# Patient Record
Sex: Female | Born: 1990 | Hispanic: Yes | Marital: Single | Smoking: Never smoker
Health system: Southern US, Community
[De-identification: ages and names within clinical notes are randomized; demographics above are authoritative.]

## PROBLEM LIST (undated history)

## (undated) DIAGNOSIS — R41840 Attention and concentration deficit: Secondary | ICD-10-CM

---
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
## 2012-11-12 ENCOUNTER — Other Ambulatory Visit: Payer: Self-pay

## 2012-11-30 ENCOUNTER — Encounter (HOSPITAL_COMMUNITY): Payer: Self-pay | Admitting: Emergency Medicine

## 2012-11-30 ENCOUNTER — Emergency Department (HOSPITAL_COMMUNITY)
Admission: EM | Admit: 2012-11-30 | Discharge: 2012-11-30 | Disposition: A | Discharge status: Home / Self Care | Payer: PRIVATE HEALTH INSURANCE | Source: Home / Self Care | Attending: Family Medicine | Admitting: Family Medicine

## 2012-11-30 DIAGNOSIS — J069 Acute upper respiratory infection, unspecified: Secondary | ICD-10-CM

## 2012-11-30 HISTORY — DX: Attention and concentration deficit: R41.840

## 2012-11-30 NOTE — ED Provider Notes (Signed)
CSN: [id number]     Arrival date & time 11/30/12  1127 History   First MD Initiated Contact with Patient 11/30/12 1310     Chief Complaint  Patient presents with  . URI   (Consider location/radiation/quality/duration/timing/severity/associated sxs/prior Treatment) Patient is a 22 y.o. female presenting with URI. The history is provided by the patient.  URI Presenting symptoms: congestion, cough and ear pain   Presenting symptoms: no fever, no rhinorrhea and no sore throat   Severity:  Moderate Onset quality:  Gradual Duration:  1 week Timing:  Constant Progression:  Unchanged Chronicity:  New Relieved by:  None tried Worsened by:  Nothing tried Ineffective treatments:  None tried   Past Medical History  Diagnosis Date  . Attention deficit    History reviewed. No pertinent past surgical history. Family History  Problem Relation Age of Onset  . Hypertension Mother   . Cancer Other    History  Substance Use Topics  . Smoking status: Never Smoker   . Smokeless tobacco: Never Used  . Alcohol Use: No   OB History   Grav Para Term Preterm Abortions TAB SAB Ect Mult Living                 Review of Systems  Constitutional: Negative for fever.  HENT: Positive for congestion, ear pain and postnasal drip. Negative for ear discharge, rhinorrhea, sinus pressure and sore throat.   Respiratory: Positive for cough.     Allergies  Review of patient's allergies indicates no known allergies.  Home Medications   Current Outpatient Rx  Name  Route  Sig  Dispense  Refill  . methylphenidate (CONCERTA) 54 MG CR tablet   Oral   Take 54 mg by mouth every morning.         . norethindrone-ethinyl estradiol (JUNEL FE,GILDESS FE,LOESTRIN FE) 1-20 MG-MCG tablet   Oral   Take 1 tablet by mouth daily.         . fluconazole (DIFLUCAN) 150 MG tablet   Oral   Take 1 tablet (150 mg total) by mouth daily.   2 tablet   5   . EXPIRED: fluticasone (FLONASE) 50 MCG/ACT nasal spray   Nasal   Place 2 sprays into the nose daily.   16 g   6    BP 108/72  Pulse 86  Temp(Src) 98.5 F (36.9 C) (Oral)  Resp 20  SpO2 98%  LMP 10/30/2012 Physical Exam  Constitutional: She appears well-developed and well-nourished. No distress.  HENT:  Right Ear: External ear and ear canal normal. A middle ear effusion is present.  Left Ear: Tympanic membrane, external ear and ear canal normal.  Nose: Mucosal edema present. No rhinorrhea. Right sinus exhibits no maxillary sinus tenderness and no frontal sinus tenderness. Left sinus exhibits no maxillary sinus tenderness and no frontal sinus tenderness.  Mouth/Throat: Oropharynx is clear and moist. No oropharyngeal exudate, posterior oropharyngeal edema, posterior oropharyngeal erythema or tonsillar abscesses.  R nasal mucosa edema, R tonsil slightly enlarged, R mid ear effusion.   Cardiovascular: Normal rate and regular rhythm.   Pulmonary/Chest: Effort normal and breath sounds normal.    ED Course  Procedures (including critical care time) Labs Review Labs Reviewed - No data to display Imaging Review No results found.  EKG Interpretation    Date/Time:    Ventricular Rate:    PR Interval:    QRS Duration:   QT Interval:    QTC Calculation:   R Axis:  Text Interpretation:              MDM   1. URI (upper respiratory infection)   Sx mostly on r side.  Recommended pt use saline nasal spray, afrin and cold medicine with decongestant like sudafed to help manage sx, and to return if no improvement with tx.      Cathlyn Parsons, NP 11/30/12 [phone]

## 2012-11-30 NOTE — ED Provider Notes (Signed)
Medical screening examination/treatment/procedure(s) were performed by a resident physician or non-physician practitioner and as the supervising physician I was immediately available for consultation/collaboration.  Clementeen Graham, MD      Rodolph Bong, MD 11/30/12 2111

## 2012-11-30 NOTE — ED Notes (Signed)
C/o URI type syx x 1 week, minimal relief w OTC medications

## 2013-04-13 ENCOUNTER — Emergency Department (HOSPITAL_COMMUNITY): Payer: PRIVATE HEALTH INSURANCE

## 2013-04-13 ENCOUNTER — Encounter (HOSPITAL_COMMUNITY): Payer: Self-pay | Admitting: Emergency Medicine

## 2013-04-13 ENCOUNTER — Emergency Department (HOSPITAL_COMMUNITY)
Admission: EM | Admit: 2013-04-13 | Discharge: 2013-04-13 | Disposition: A | Discharge status: Home / Self Care | Payer: PRIVATE HEALTH INSURANCE | Source: Home / Self Care

## 2013-04-13 ENCOUNTER — Emergency Department (HOSPITAL_COMMUNITY)
Admission: EM | Admit: 2013-04-13 | Discharge: 2013-04-13 | Disposition: A | Discharge status: Home / Self Care | Payer: PRIVATE HEALTH INSURANCE | Attending: Emergency Medicine | Admitting: Emergency Medicine

## 2013-04-13 DIAGNOSIS — Z79899 Other long term (current) drug therapy: Secondary | ICD-10-CM | POA: Insufficient documentation

## 2013-04-13 DIAGNOSIS — Z3202 Encounter for pregnancy test, result negative: Secondary | ICD-10-CM | POA: Insufficient documentation

## 2013-04-13 DIAGNOSIS — R1013 Epigastric pain: Secondary | ICD-10-CM | POA: Diagnosis not present

## 2013-04-13 DIAGNOSIS — R1110 Vomiting, unspecified: Secondary | ICD-10-CM

## 2013-04-13 DIAGNOSIS — R630 Anorexia: Secondary | ICD-10-CM | POA: Diagnosis not present

## 2013-04-13 DIAGNOSIS — F988 Other specified behavioral and emotional disorders with onset usually occurring in childhood and adolescence: Secondary | ICD-10-CM | POA: Insufficient documentation

## 2013-04-13 DIAGNOSIS — R109 Unspecified abdominal pain: Secondary | ICD-10-CM | POA: Diagnosis present

## 2013-04-13 LAB — CBC WITH DIFFERENTIAL/PLATELET
Basophils Absolute: 0 10*3/uL (ref 0.0–0.1)
Basophils Relative: 0 % (ref 0–1)
Eosinophils Absolute: 0 10*3/uL (ref 0.0–0.7)
Eosinophils Relative: 0 % (ref 0–5)
HCT: 40.1 % (ref 36.0–46.0)
Hemoglobin: 13.6 g/dL (ref 12.0–15.0)
LYMPHS ABS: 1.2 10*3/uL (ref 0.7–4.0)
LYMPHS PCT: 18 % (ref 12–46)
MCH: 27.1 pg (ref 26.0–34.0)
MCHC: 33.9 g/dL (ref 30.0–36.0)
MCV: 80 fL (ref 78.0–100.0)
Monocytes Absolute: 0.5 10*3/uL (ref 0.1–1.0)
Monocytes Relative: 9 % (ref 3–12)
NEUTROS ABS: 4.6 10*3/uL (ref 1.7–7.7)
NEUTROS PCT: 73 % (ref 43–77)
PLATELETS: 197 10*3/uL (ref 150–400)
RBC: 5.01 MIL/uL (ref 3.87–5.11)
RDW: 13.1 % (ref 11.5–15.5)
WBC: 6.4 10*3/uL (ref 4.0–10.5)

## 2013-04-13 LAB — URINALYSIS, ROUTINE W REFLEX MICROSCOPIC
GLUCOSE, UA: NEGATIVE mg/dL
HGB URINE DIPSTICK: NEGATIVE
Ketones, ur: 15 mg/dL — AB
Nitrite: NEGATIVE
PH: 6 (ref 5.0–8.0)
PROTEIN: NEGATIVE mg/dL
Specific Gravity, Urine: 1.037 — ABNORMAL HIGH (ref 1.005–1.030)
Urobilinogen, UA: 1 mg/dL (ref 0.0–1.0)

## 2013-04-13 LAB — COMPREHENSIVE METABOLIC PANEL
ALK PHOS: 84 U/L (ref 39–117)
ALT: 16 U/L (ref 0–35)
AST: 18 U/L (ref 0–37)
Albumin: 3.5 g/dL (ref 3.5–5.2)
BUN: 19 mg/dL (ref 6–23)
CO2: 27 meq/L (ref 19–32)
Calcium: 9.1 mg/dL (ref 8.4–10.5)
Chloride: 99 mEq/L (ref 96–112)
Creatinine, Ser: 0.8 mg/dL (ref 0.50–1.10)
GLUCOSE: 82 mg/dL (ref 70–99)
POTASSIUM: 3.8 meq/L (ref 3.7–5.3)
SODIUM: 140 meq/L (ref 137–147)
Total Bilirubin: 0.7 mg/dL (ref 0.3–1.2)
Total Protein: 7.9 g/dL (ref 6.0–8.3)

## 2013-04-13 LAB — URINE MICROSCOPIC-ADD ON

## 2013-04-13 LAB — LIPASE, BLOOD: Lipase: 13 U/L (ref 11–59)

## 2013-04-13 LAB — POC URINE PREG, ED: Preg Test, Ur: NEGATIVE

## 2013-04-13 MED ORDER — GI COCKTAIL ~~LOC~~
30.0000 mL | Freq: Once | ORAL | Status: AC
  Administered 2013-04-13: 30 mL via ORAL

## 2013-04-13 MED ORDER — OMEPRAZOLE 20 MG PO CPDR: 20.0000 mg | DELAYED_RELEASE_CAPSULE | Freq: Every day | ORAL | Status: AC

## 2013-04-13 MED ORDER — ONDANSETRON 4 MG PO TBDP
ORAL_TABLET | ORAL | Status: AC
  Filled 2013-04-13: qty 1

## 2013-04-13 MED ORDER — GI COCKTAIL ~~LOC~~
ORAL | Status: AC
  Filled 2013-04-13: qty 30

## 2013-04-13 MED ORDER — ONDANSETRON 4 MG PO TBDP
4.0000 mg | ORAL_TABLET | Freq: Once | ORAL | Status: AC
  Administered 2013-04-13: 4 mg via ORAL

## 2013-04-13 NOTE — ED Provider Notes (Signed)
Medical screening examination/treatment/procedure(s) were performed by resident physician or non-physician practitioner and as supervising physician I was immediately available for consultation/collaboration.   Erisha Paugh DOUGLAS MD.   Alfons Sulkowski D Gordon Carlson, MD 04/13/13 1401 

## 2013-04-13 NOTE — ED Notes (Signed)
C/o of epigastric pain x 4 days.   States pain goes away with pressing on abdomen.   On set of vomiting last night.  Denies diarrhea.   No changes in diet or new medications.   No relief with otc meds

## 2013-04-13 NOTE — ED Provider Notes (Signed)
CSN: [id number]     Arrival date & time 04/13/13  1018 History   First MD Initiated Contact with Patient 04/13/13 1107     Chief Complaint  Patient presents with  . Abdominal Pain  . Neck Pain   (Consider location/radiation/quality/duration/timing/severity/associated sxs/prior Treatment) HPI Comments: 23 Y O obese F with epigastric pain for about 1 week. Nonradiating, worse with drinking or eating. Last PM with onset of vomiting, particularly after drinking fluids. Unable to eat food. Pain comes and goes. Nothing makes it better. No meds tried.  Lives in [place] , [place] and wll not be going back home for another week.   Past Medical History  Diagnosis Date  . Attention deficit    History reviewed. No pertinent past surgical history. Family History  Problem Relation Age of Onset  . Hypertension Mother   . Cancer Other    History  Substance Use Topics  . Smoking status: Never Smoker   . Smokeless tobacco: Never Used  . Alcohol Use: No   OB History   Grav Para Term Preterm Abortions TAB SAB Ect Mult Living                 Review of Systems  Constitutional: Positive for activity change. Negative for fever.  HENT: Negative.   Respiratory: Negative.   Cardiovascular: Negative.   Gastrointestinal: Positive for nausea, vomiting and abdominal pain. Negative for diarrhea, constipation, blood in stool and abdominal distention.  Genitourinary: Negative.   Musculoskeletal: Negative.   Skin: Negative.   Neurological: Negative for dizziness, speech difficulty and headaches.    Allergies  Review of patient's allergies indicates no known allergies.  Home Medications   Current Outpatient Rx  Name  Route  Sig  Dispense  Refill  . methylphenidate (CONCERTA) 54 MG CR tablet   Oral   Take 54 mg by mouth every morning.         . norethindrone-ethinyl estradiol (JUNEL FE,GILDESS FE,LOESTRIN FE) 1-20 MG-MCG tablet   Oral   Take 1 tablet by mouth daily.         . fluconazole  (DIFLUCAN) 150 MG tablet   Oral   Take 1 tablet (150 mg total) by mouth daily.   2 tablet   5   . EXPIRED: fluticasone (FLONASE) 50 MCG/ACT nasal spray   Nasal   Place 2 sprays into the nose daily.   16 g   6    BP 120/74  Pulse 92  Temp(Src) 98.4 F (36.9 C) (Oral)  Resp 18  SpO2 100% Physical Exam  Nursing note and vitals reviewed. Constitutional: She is oriented to person, place, and time. She appears well-developed and well-nourished. No distress.  Eyes: Conjunctivae and EOM are normal.  Neck: Normal range of motion. Neck supple.  Cardiovascular: Normal rate, regular rhythm and normal heart sounds.   Pulmonary/Chest: Effort normal and breath sounds normal. No respiratory distress. She has no wheezes. She has no rales.  Abdominal: Soft. Bowel sounds are normal. She exhibits no distension and no mass. There is tenderness. There is no rebound.  Tenderness in epigastrium, lesser tenderness in RUQ.  Musculoskeletal: She exhibits no edema.  Neurological: She is alert and oriented to person, place, and time. She exhibits normal muscle tone.  Skin: Skin is warm and dry. No rash noted.  Psychiatric: She has a normal mood and affect.    ED Course  Procedures (including critical care time) Labs Review Labs Reviewed - No data to display Imaging Review No results  found.   MDM   1. Epigastric pain   2. Vomiting     Beeped Dr. Elnoria HowardHung 1200h. Re attempted beep 1235h.  Transfer to the ED via shuttle for eval and management of epigastric pain. Diff includes gastritis, PUD, biliary/cholestatic dz, pancreatitis... Pt has no local PCP. We have attempted to contact GI to see if they can initiate tests this week and f/u but have not responded.  Received GI Cocktail and zofran with partial relief, however, difficult to discern since the pain is colicy, comes and goes.      Hayden Rasmussenavid Loletha Bertini, NP 04/13/13 1308

## 2013-04-13 NOTE — Discharge Instructions (Signed)
Please follow-up with Gastroenterology in the next 2 weeks, or see your Primary Care Provider. Abdominal Pain, Adult Many things can cause abdominal pain. Usually, abdominal pain is not caused by a disease and will improve without treatment. It can often be observed and treated at home. Your health care provider will do a physical exam and possibly order blood tests and X-rays to help determine the seriousness of your pain. However, in many cases, more time must pass before a clear cause of the pain can be found. Before that point, your health care provider may not know if you need more testing or further treatment. HOME CARE INSTRUCTIONS  Monitor your abdominal pain for any changes. The following actions may help to alleviate any discomfort you are experiencing:  Only take over-the-counter or prescription medicines as directed by your health care provider.  Do not take laxatives unless directed to do so by your health care provider.  Try a clear liquid diet (broth, tea, or water) as directed by your health care provider. Slowly move to a bland diet as tolerated. SEEK MEDICAL CARE IF:  You have unexplained abdominal pain.  You have abdominal pain associated with nausea or diarrhea.  You have pain when you urinate or have a bowel movement.  You experience abdominal pain that wakes you in the night.  You have abdominal pain that is worsened or improved by eating food.  You have abdominal pain that is worsened with eating fatty foods. SEEK IMMEDIATE MEDICAL CARE IF:   Your pain does not go away within 2 hours.  You have a fever.  You keep throwing up (vomiting).  Your pain is felt only in portions of the abdomen, such as the right side or the left lower portion of the abdomen.  You pass bloody or black tarry stools. MAKE SURE YOU:  Understand these instructions.   Will watch your condition.   Will get help right away if you are not doing well or get worse.  Document  Released: 10/03/2004 Document Revised: 10/14/2012 Document Reviewed: 09/02/2012 [facility] Patient Information 2014 [place], [place].   Emergency Department Resource Guide 1) Find a Doctor and Pay Out of Pocket Although you won't have to find out who is covered by your insurance plan, it is a good idea to ask around and get recommendations. You will then need to call the office and see if the doctor you have chosen will accept you as a new patient and what types of options they offer for patients who are self-pay. Some doctors offer discounts or will set up payment plans for their patients who do not have insurance, but you will need to ask so you aren't surprised when you get to your appointment.  2) Contact Your Local Health Department Not all health departments have doctors that can see patients for sick visits, but many do, so it is worth a call to see if yours does. If you don't know where your local health department is, you can check in your phone book. The CDC also has a tool to help you locate your state's health department, and many state websites also have listings of all of their local health departments.  3) Find a Walk-in Clinic If your illness is not likely to be very severe or complicated, you may want to try a walk in clinic. These are popping up all over the country in pharmacies, drugstores, and shopping centers. They're usually staffed by nurse practitioners or physician assistants that have been trained to  treat common illnesses and complaints. They're usually fairly quick and inexpensive. However, if you have serious medical issues or chronic medical problems, these are probably not your best option.  No Primary Care Doctor: - Call Health Connect at  [phone] - they can help you locate a primary care doctor that  accepts your insurance, provides certain services, etc. - Physician Referral Service- [phone]  Chronic Pain Problems: Organization         Address  Phone    Notes  [facility]  [phone] Patients need to be referred by their primary care doctor.   Medication Assistance: Organization         Address  Phone   Notes  [facility] Medication [facility] [address] [place], [place] [address] [phone] --Must be a resident of [facility] -- Must have NO insurance coverage whatsoever (no Medicaid/ Medicare, etc.) -- The pt. MUST have a primary care doctor that directs their care regularly and follows them in the community   [facility]  [phone]   [employer]  [phone]    Agencies that provide inexpensive medical care: Organization         Address  Phone   Notes  [facility]  [phone]   [facility]    [phone]   [facility] [address] [place], [place] [address] [phone]   [facility] [address], [place] [phone]   Planned Parenthood    [phone]   [facility]    [phone]   [facility]  [address], [place] Phone:  [phone], Fax:  [phone] Hours of Operation:  9 am - 6 pm, M-F.  Also accepts Medicaid/Medicare and self-pay.  [facility]  [address], [place] Phone: [phone], Fax: [phone]. Hours of Operation:  8:30 am - 5:30 pm, M-F.  Also accepts Medicaid and self-pay.  [facility] [place] [address], [place] Phone: [phone]   [facility] [address] Natasha Bence [place], [place] [phone], Ext. [phone] Mondays & Thursdays: 7-9 AM.  First 15 patients are seen on a first come, first serve basis.    Medicaid-accepting [employer] Providers:  Organization         Address  Phone   Notes  [facility] [address],  [phone] Also accepts self-pay patients.  [facility]  [address] Laurell Josephs [place], [place]  [phone]   [facility] [address], [place] [phone]   [facility] [address] [place] [phone]   Renaye Rakers [address], [place]   [phone] Only accepts [place] Access [place] patients after they have their name applied to their card.   Self-Pay (no insurance) in [facility]:  Organization         Address  Phone   Notes  Sickle Cell Patients, [facility] [address] [place], [place] [phone]   [facility] [address] [place], [place] [phone]   [facility] [place]  [address], [place] [phone]   [facility]/Dr. Osei-Bonsu  [address], [place] or [address], [place] [phone] Phone number for both [facility] and [place] locations is the same.  [facility] [address], [place] [phone]   [facility] [place] [address] [place] [address], [place] or [address] [phone]) [phone]   [facility] [address], [place] [phone], phone; [phone], fax Sees patients 1st and 3rd Saturday of every month.  Must not qualify for public or private insurance (i.e. Medicaid, Medicare, [place] Health Choice, Veterans' Benefits)  Household income should be no more than 200% of the poverty level The clinic cannot treat you if you are pregnant or think you are pregnant  Sexually transmitted diseases are not treated at the clinic.    Dental Care: Organization         Address  Phone  Notes  [facility] [address], [place] [phone] Accepts children up to age 21 who are enrolled in [place] or [place] Health Choice; pregnant women with a Medicaid card; and children who have  applied for Medicaid or [place] Health Choice, but were declined, whose parents can pay a reduced fee at time of service.  [facility] [place]  [address], [place] [phone] Accepts children up to age 21 who are enrolled in [place] or [place] Health Choice; pregnant women with a Medicaid card; and children who have applied for Medicaid or  Health Choice, but were declined, whose parents can pay a reduced fee at time of service.  [facility]  [address], [place] [phone] Patients are seen by appointment only. Walk-ins are not accepted. [facility] will see patients 21 years of age and older. Monday - Tuesday (8am-5pm) Most Wednesdays (8:30-5pm) $30 per visit, cash only  [facility]  [address], [facility] [phone] Patients are seen by appointment only. Walk-ins are not accepted. [facility] will see patients 21 years of age and older. One Wednesday Evening (Monthly: Volunteer Based).  $30 per visit, cash only  [employer] [facility]  [phone] for adults; Children under age 34, call Graduate Pediatric Dentistry at [phone]. Children aged 634-14, please call [phone] to request a pediatric application.  Dental services are provided in all areas of dental care including fillings, crowns and bridges, complete and partial dentures, implants, gum treatment, root canals, and extractions. Preventive care is also provided. Treatment is provided to both adults and children. Patients are selected via a lottery and there is often a waiting list.   [facility] [address], [place]  [phone]) [phone] [email or web address]   [facility] [address], [place], [place] [phone], Ext. [phone] Second and Fourth Thursday of each month, opens at 6:30 AM; Clinic ends at 9 AM.  Patients are seen on a first-come first-served basis, and a  limited number are seen during each clinic.   [facility]  [address], [place], [place] [phone]   Eligibility Requirements You must have lived in [place], [place], or [place] counties for at least the last three months.   You cannot be eligible for state or federal sponsored [place] Cityhealthcare insurance, including CIG[employer]Veterans Administration, [place]icaid, or [employer]icare.   You generally cannot be eligible for healthcare insurance through your employer.    How to apply: Eligibility screenings are held every  Tuesday and Wednesday afternoon from 1:00 pm until 4:00 pm. You do not need an appointment for the interview!  [facility] [address], [place], [place] [phone]   [facility] Health Department  [phone]   [facility] Health Department  [phone]   [facility] Health Department  [phone]    Behavioral Health Resources in the Community: Intensive Outpatient Programs Organization         Address  Phone  Notes  [facility] Services [address] [place], [place] [phone]   [facility] [address], [place], [place] [phone]   [facility] [address] [place], [place]  [phone]   [facility] Mental Health [address]  [place], [place] [phone] or [phone]   Substance Abuse Resources Organization         Address  Phone  Notes  Alcohol and Drug Services  [phone]   [facility]  [phone]   [facility] [place]  [phone]   Floydene Flock  [phone]   Residential & Outpatient Substance Abuse Program  [phone]   Psychological Services Organization         Address  Phone  Notes  [facility] Behavioral Health  [phone]   [facility] Services  [phone]   [facility] Mental Health [address] [place] [phone] or [phone]    Mobile Crisis Teams Organization          Address  Phone  Notes  [facility], Mobile Crisis Care Unit  [phone]   [facility]  [address] [place], [place] [phone]   Doristine Locks [address] [place] [phone]    Self-Help/Support Groups Organization         Address  Phone             Notes  [facility] [place] - variety of support groups  [phone]- I7437963 Call for more information  Narcotics Anonymous (NA), [facility] [address], [employer] [place]  2 meetings at this location   Statistician         Address  Phone  Notes  [facility] [address],    [place]  [phone]   [facility]  [address], [place] [address], [place], [place] [phone]   [facility] [address] [place], [place] [phone] Admissions: 8am-3pm M-F  [facility] [address]    [place], [place] [phone]   [facility] [address] [place], [place], [place] [phone]   [facility] [address],  [place], [place] [phone]   [facility] - Intensive Outpatient [address] Laurell Josephs [address], [place], [place] [phone]   [facility] ([facility] [address] [place]  [place], [place] [phone] or [phone]   [facility] (RT[facility]) [address], [place], [place] [phone] Accepts Medicaid  [facility] [place] [address],  [place] [phone] Substance Abuse/Addiction Treatment   [facility] Organization         Address  Phone  Notes  [facility]  [phone]   Angie Fava, PhD [address] Ervin Knack [place], [place]   [phone] or [phone]   [facility] Behavioral   [address] [place], [place] [phone]   [facility] [address] [place], [place] [phone] Insurance/Medicaid/sponsorship  through [facility]  [facility] [address] [place], [place] [phone] [place], [place] [phone]    Dr. Adele Schilder  [phone]   [facility] Dept. 1) [address] [place] 2) [place] 3)  [place] [address], [place] [phone]  [phone]   [place] [phone] or [phone] (After Hours)

## 2013-04-13 NOTE — ED Notes (Signed)
PA at bedside.

## 2013-04-13 NOTE — ED Notes (Signed)
Pt was sent here from ucc. Reports mid abd pain x 1 week, has become more severe and unable to eat since yesterday, vomiting since last night. Denies diarrhea. Sent here for US.

## 2013-04-13 NOTE — ED Provider Notes (Signed)
CSN: [id number]     Arrival date & time 04/13/13  1318 History   First MD Initiated Contact with Patient 04/13/13 1704     Chief Complaint  Patient presents with  . Abdominal Pain     (Consider location/radiation/quality/duration/timing/severity/associated sxs/prior Treatment) HPI Comments: Pt presents to the Emergency Department with chief complaint of mid epigatric pain x 4 days and vomiting since last night. Pt was seen in Urgent Care and given GI cocktail which provided some relief, but was sent to the ED for labs and ultrasound when GI could not be reached. Pain is nonradiating and colicy. Vomiting began last night after eating dinner, with last episode at 6am this morning. Pt has been unable to keep food down since vomiting. Nothing makes it better or worse. Pt reports pain at 5/10. Pt denies HA, fever, chills, and diarrhea.   The history is provided by the patient. No language interpreter was used.    Past Medical History  Diagnosis Date  . Attention deficit    History reviewed. No pertinent past surgical history. Family History  Problem Relation Age of Onset  . Hypertension Mother   . Cancer Other    History  Substance Use Topics  . Smoking status: Never Smoker   . Smokeless tobacco: Never Used  . Alcohol Use: No   OB History   Grav Para Term Preterm Abortions TAB SAB Ect Mult Living                 Review of Systems  Constitutional: Positive for activity change and appetite change. Negative for fever and chills.  HENT: Negative.   Respiratory: Negative.   Cardiovascular: Negative.   Gastrointestinal: Positive for vomiting and abdominal pain (epigastric). Negative for nausea, diarrhea and constipation.  Endocrine: Negative.   Genitourinary: Negative for dysuria, vaginal bleeding, vaginal discharge and difficulty urinating.  Skin: Negative.   Neurological: Negative for dizziness and headaches.  Psychiatric/Behavioral: Negative.       Allergies  Review of  patient's allergies indicates no known allergies.  Home Medications   Current Outpatient Rx  Name  Route  Sig  Dispense  Refill  . ibuprofen (ADVIL,MOTRIN) 200 MG tablet   Oral   Take 200-800 mg by mouth every 4 (four) hours as needed for headache.         . methylphenidate (CONCERTA) 54 MG CR tablet   Oral   Take 54 mg by mouth every morning.         . norethindrone-ethinyl estradiol (JUNEL FE,GILDESS FE,LOESTRIN FE) 1-20 MG-MCG tablet   Oral   Take 1 tablet by mouth daily.          BP 133/79  Pulse 89  Temp(Src) 99 F (37.2 C) (Oral)  Resp 18  Ht 5\' 4"  (1.626 m)  Wt 267 lb 9.6 oz (121.383 kg)  BMI 45.91 kg/m2  SpO2 100% Physical Exam  Constitutional: She is oriented to person, place, and time. Vital signs are normal. She appears well-developed and well-nourished.  Eyes: Conjunctivae and EOM are normal.  Neck: Normal range of motion. Neck supple.  Cardiovascular: Normal rate, regular rhythm and normal heart sounds.   Pulmonary/Chest: Effort normal and breath sounds normal.  Abdominal: Soft. Bowel sounds are normal. She exhibits no distension and no mass. There is no tenderness. There is no rebound and no guarding.  No focal abdominal tenderness, no RLQ tenderness or pain at McBurney's point, no RUQ tenderness or Murphy's sign, no left-sided abdominal tenderness, no fluid wave, or  signs of peritonitis   Neurological: She is alert and oriented to person, place, and time.  Skin: Skin is warm, dry and intact.  Psychiatric: She has a normal mood and affect. Her speech is normal and behavior is normal.    ED Course  Procedures (including critical care time) Results for orders placed during the hospital encounter of 04/13/13  CBC WITH DIFFERENTIAL      Result Value Ref Range   WBC 6.4  4.0 - 10.5 K/uL   RBC 5.01  3.87 - 5.11 MIL/uL   Hemoglobin 13.6  12.0 - 15.0 g/dL   HCT 16.1  09.6 - 04.5 %   MCV 80.0  78.0 - 100.0 fL   MCH 27.1  26.0 - 34.0 pg   MCHC 33.9  30.0  - 36.0 g/dL   RDW 40.9  81.1 - 91.4 %   Platelets 197  150 - 400 K/uL   Neutrophils Relative % 73  43 - 77 %   Neutro Abs 4.6  1.7 - 7.7 K/uL   Lymphocytes Relative 18  12 - 46 %   Lymphs Abs 1.2  0.7 - 4.0 K/uL   Monocytes Relative 9  3 - 12 %   Monocytes Absolute 0.5  0.1 - 1.0 K/uL   Eosinophils Relative 0  0 - 5 %   Eosinophils Absolute 0.0  0.0 - 0.7 K/uL   Basophils Relative 0  0 - 1 %   Basophils Absolute 0.0  0.0 - 0.1 K/uL  COMPREHENSIVE METABOLIC PANEL      Result Value Ref Range   Sodium 140  137 - 147 mEq/L   Potassium 3.8  3.7 - 5.3 mEq/L   Chloride 99  96 - 112 mEq/L   CO2 27  19 - 32 mEq/L   Glucose, Bld 82  70 - 99 mg/dL   BUN 19  6 - 23 mg/dL   Creatinine, Ser 7.82  0.50 - 1.10 mg/dL   Calcium 9.1  8.4 - 95.6 mg/dL   Total Protein 7.9  6.0 - 8.3 g/dL   Albumin 3.5  3.5 - 5.2 g/dL   AST 18  0 - 37 U/L   ALT 16  0 - 35 U/L   Alkaline Phosphatase 84  39 - 117 U/L   Total Bilirubin 0.7  0.3 - 1.2 mg/dL   GFR calc non Af Amer >90  >90 mL/min   GFR calc Af Amer >90  >90 mL/min  LIPASE, BLOOD      Result Value Ref Range   Lipase 13  11 - 59 U/L  URINALYSIS, ROUTINE W REFLEX MICROSCOPIC      Result Value Ref Range   Color, Urine AMBER (*) YELLOW   APPearance CLEAR  CLEAR   Specific Gravity, Urine 1.037 (*) 1.005 - 1.030   pH 6.0  5.0 - 8.0   Glucose, UA NEGATIVE  NEGATIVE mg/dL   Hgb urine dipstick NEGATIVE  NEGATIVE   Bilirubin Urine SMALL (*) NEGATIVE   Ketones, ur 15 (*) NEGATIVE mg/dL   Protein, ur NEGATIVE  NEGATIVE mg/dL   Urobilinogen, UA 1.0  0.0 - 1.0 mg/dL   Nitrite NEGATIVE  NEGATIVE   Leukocytes, UA SMALL (*) NEGATIVE  URINE MICROSCOPIC-ADD ON      Result Value Ref Range   Squamous Epithelial / LPF FEW (*) RARE   WBC, UA 3-6  <3 WBC/hpf   Bacteria, UA FEW (*) RARE   Urine-Other MUCOUS PRESENT    POC URINE PREG,  ED      Result Value Ref Range   Preg Test, Ur NEGATIVE  NEGATIVE   [place] Abdomen Complete  04/13/2013   CLINICAL DATA:  Epigastric  pain.  EXAM: ULTRASOUND ABDOMEN COMPLETE  COMPARISON:  None.  FINDINGS: Gallbladder:  No gallstones or wall thickening visualized. No sonographic Murphy sign noted.  Common bile duct:  Diameter: 3.4 mm, normal.  Liver:  No focal lesion identified. Within normal limits in parenchymal echogenicity.  IVC:  Normal.  Pancreas:  Normal.  Spleen:  Normal.  10.8 cm in length.  Right Kidney:  Length: 10.5 cm. Echogenicity within normal limits. No mass or hydronephrosis visualized.  Left Kidney:  Length: 12.6 cm. Echogenicity within normal limits. No mass or hydronephrosis visualized.  Abdominal aorta:  1.8 cm maximum diameter.  Other findings:  None.  IMPRESSION: Normal exam.   Electronically Signed   By: Geanie CooleyJim  Maxwell M.D.   On: 04/13/2013 18:30      EKG Interpretation None      MDM   Final diagnoses:  Abdominal pain    Patient with abdominal pain x1 week. Also reports some vomiting. Labs are unremarkable. Abdomen is benign. No evidence of acute or surgical abdomen. Ultrasound is negative. Will refer the patient to gastroenterology, and have her followup with her primary care doctor within 2 weeks. Patient understands and agrees with the plan. She stabilized for discharge.    Roxy Horsemanobert Calen Posch, PA-C 04/13/13 1857

## 2013-04-14 NOTE — ED Provider Notes (Signed)
Medical screening examination/treatment/procedure(s) were performed by non-physician practitioner and as supervising physician I was immediately available for consultation/collaboration.   EKG Interpretation None       Donae Kueker R. Koren Sermersheim, MD 04/14/13 0022 

## 2014-05-12 ENCOUNTER — Encounter (HOSPITAL_COMMUNITY): Payer: Self-pay | Admitting: Emergency Medicine

## 2014-05-12 ENCOUNTER — Emergency Department (HOSPITAL_COMMUNITY)
Admission: EM | Admit: 2014-05-12 | Discharge: 2014-05-12 | Disposition: A | Discharge status: Home / Self Care | Payer: PRIVATE HEALTH INSURANCE | Source: Home / Self Care | Attending: Family Medicine | Admitting: Family Medicine

## 2014-05-12 DIAGNOSIS — H66002 Acute suppurative otitis media without spontaneous rupture of ear drum, left ear: Secondary | ICD-10-CM | POA: Diagnosis not present

## 2014-05-12 MED ORDER — CEFDINIR 300 MG PO CAPS: 300.0000 mg | ORAL_CAPSULE | Freq: Two times a day (BID) | ORAL | Status: DC

## 2014-05-12 MED ORDER — FLUCONAZOLE 150 MG PO TABS: 150.0000 mg | ORAL_TABLET | Freq: Once | ORAL | Status: DC

## 2014-05-12 NOTE — ED Notes (Signed)
C/o headache, eyes hurting, overall feeling bad.  General malaise.

## 2014-05-12 NOTE — ED Provider Notes (Signed)
Cristina Mack is a 24 y.o. female who presents to Urgent Care today for left ear pain associated with subjective fever headache fatigue nausea and runny nose. Patient has had a week's worth of URI symptoms but developed ear pain and decreased hearing today. No vomiting or diarrhea. She's tried Tylenol which helps some.   Past Medical History  Diagnosis Date  . Attention deficit    History reviewed. No pertinent past surgical history. History  Substance Use Topics  . Smoking status: Never Smoker   . Smokeless tobacco: Never Used  . Alcohol Use: No   ROS as above Medications: No current facility-administered medications for this encounter.   Current Outpatient Prescriptions  Medication Sig Dispense Refill  . cefdinir (OMNICEF) 300 MG capsule Take 1 capsule (300 mg total) by mouth 2 (two) times daily. 14 capsule 0  . fluconazole (DIFLUCAN) 150 MG tablet Take 1 tablet (150 mg total) by mouth once. 1 tablet 1  . ibuprofen (ADVIL,MOTRIN) 200 MG tablet Take 200-800 mg by mouth every 4 (four) hours as needed for headache.    . methylphenidate (CONCERTA) 54 MG CR tablet Take 54 mg by mouth every morning.    . norethindrone-ethinyl estradiol (JUNEL FE,GILDESS FE,LOESTRIN FE) 1-20 MG-MCG tablet Take 1 tablet by mouth daily.    Marland Kitchen. omeprazole (PRILOSEC) 20 MG capsule Take 1 capsule (20 mg total) by mouth daily. 30 capsule 0   No Known Allergies   Exam:  BP 151/83 mmHg  Pulse 95  Temp(Src) 98.7 F (37.1 C) (Oral)  Resp 18  SpO2 100% Gen: Well NAD HEENT: EOMI,  MMM left ear tympanic membrane effusion and erythematous and bulging. Right is normal mastoids nontender bilaterally. clear nasal discharge. Lungs: Normal work of breathing. CTABL Heart: RRR no MRG Abd: NABS, Soft. Nondistended, Nontender Exts: Brisk capillary refill, warm and well perfused.   No results found for this or any previous visit (from the past 24 hour(s)). No results found.  Assessment and Plan: 24 y.o. female with  otitis media treat with Omnicef. Fluconazole in case patient develops yeast infection.  Discussed warning signs or symptoms. Please see discharge instructions. Patient expresses understanding.     Rodolph BongEvan S Taariq Leitz, MD 05/12/14 2002

## 2014-05-12 NOTE — Discharge Instructions (Signed)
Thank you for coming in today. °Call or go to the emergency room if you get worse, have trouble breathing, have chest pains, or palpitations.  ° ° °Otitis Media With Effusion °Otitis media with effusion is the presence of fluid in the middle ear. This is a common problem in children, which often follows ear infections. It may be present for weeks or longer after the infection. Unlike an acute ear infection, otitis media with effusion refers only to fluid behind the ear drum and not infection. Children with repeated ear and sinus infections and allergy problems are the most likely to get otitis media with effusion. °CAUSES  °The most frequent cause of the fluid buildup is dysfunction of the eustachian tubes. These are the tubes that drain fluid in the ears to the back of the nose (nasopharynx). °SYMPTOMS  °· The main symptom of this condition is hearing loss. As a result, you or your child may: °¨ Listen to the TV at a loud volume. °¨ Not respond to questions. °¨ Ask "what" often when spoken to. °¨ Mistake or confuse one sound or word for another. °· There may be a sensation of fullness or pressure but usually not pain. °DIAGNOSIS  °· Your health care provider will diagnose this condition by examining you or your child's ears. °· Your health care provider may test the pressure in you or your child's ear with a tympanometer. °· A hearing test may be conducted if the problem persists. °TREATMENT  °· Treatment depends on the duration and the effects of the effusion. °· Antibiotics, decongestants, nose drops, and cortisone-type drugs (tablets or nasal spray) may not be helpful. °· Children with persistent ear effusions may have delayed language or behavioral problems. Children at risk for developmental delays in hearing, learning, and speech may require referral to a specialist earlier than children not at risk. °· You or your child's health care provider may suggest a referral to an ear, nose, and throat surgeon for  treatment. The following may help restore normal hearing: °¨ Drainage of fluid. °¨ Placement of ear tubes (tympanostomy tubes). °¨ Removal of adenoids (adenoidectomy). °HOME CARE INSTRUCTIONS  °· Avoid secondhand smoke. °· Infants who are breastfed are less likely to have this condition. °· Avoid feeding infants while they are lying flat. °· Avoid known environmental allergens. °· Avoid people who are sick. °SEEK MEDICAL CARE IF:  °· Hearing is not better in 3 months. °· Hearing is worse. °· Ear pain. °· Drainage from the ear. °· Dizziness. °MAKE SURE YOU:  °· Understand these instructions. °· Will watch your condition. °· Will get help right away if you are not doing well or get worse. °Document Released: 02/01/2004 Document Revised: 05/10/2013 Document Reviewed: 07/21/2012 °Exit[employer]® Patient Information ©2015 [employer]. This information is not intended to replace advice given to you by your health care provider. Make sure you discuss any questions you have with your health care provider. ° °

## 2014-06-25 ENCOUNTER — Encounter (HOSPITAL_COMMUNITY): Payer: Self-pay | Admitting: Emergency Medicine

## 2014-06-25 ENCOUNTER — Emergency Department (HOSPITAL_COMMUNITY)
Admission: EM | Admit: 2014-06-25 | Discharge: 2014-06-25 | Disposition: A | Discharge status: Home / Self Care | Payer: PRIVATE HEALTH INSURANCE | Source: Home / Self Care | Attending: Emergency Medicine | Admitting: Emergency Medicine

## 2014-06-25 DIAGNOSIS — J039 Acute tonsillitis, unspecified: Secondary | ICD-10-CM

## 2014-06-25 DIAGNOSIS — L304 Erythema intertrigo: Secondary | ICD-10-CM

## 2014-06-25 LAB — POCT RAPID STREP A: STREPTOCOCCUS, GROUP A SCREEN (DIRECT): NEGATIVE

## 2014-06-25 MED ORDER — CEFDINIR 300 MG PO CAPS: 300.0000 mg | ORAL_CAPSULE | Freq: Two times a day (BID) | ORAL | Status: AC

## 2014-06-25 MED ORDER — FLUCONAZOLE 150 MG PO TABS: 150.0000 mg | ORAL_TABLET | Freq: Once | ORAL | Status: DC

## 2014-06-25 MED ORDER — NYSTATIN 100000 UNIT/GM EX CREA: TOPICAL_CREAM | CUTANEOUS | Status: DC

## 2014-06-25 NOTE — ED Provider Notes (Signed)
CSN: [id number]     Arrival date & time 06/25/14  1929 History   First MD Initiated Contact with Patient 06/25/14 2017     Chief Complaint  Patient presents with  . Rash   (Consider location/radiation/quality/duration/timing/severity/associated sxs/prior Treatment) HPI  She is a 24 year old woman here for evaluation of sore throat and rash. She states her sore throat started about 3 days ago. It is painful to swallow. The pain radiates into her right ear. She denies any nasal congestion, rhinorrhea, nausea, vomiting. She reports a subjective fever last night. She has not tried any medications. Today, she noticed a red rash underneath her left breast. It is not painful. It does not itch. She has not tried anything.  Past Medical History  Diagnosis Date  . Attention deficit    History reviewed. No pertinent past surgical history. Family History  Problem Relation Age of Onset  . Hypertension Mother   . Cancer Other    History  Substance Use Topics  . Smoking status: Never Smoker   . Smokeless tobacco: Never Used  . Alcohol Use: No   OB History    No data available     Review of Systems As in history of present illness Allergies  Review of patient's allergies indicates no known allergies.  Home Medications   Prior to Admission medications   Medication Sig Start Date End Date Taking? Authorizing Provider  methylphenidate (CONCERTA) 54 MG CR tablet Take 54 mg by mouth every morning.   Yes Historical Provider, MD  norethindrone-ethinyl estradiol (JUNEL FE,GILDESS FE,LOESTRIN FE) 1-20 MG-MCG tablet Take 1 tablet by mouth daily.   Yes Historical Provider, MD  cefdinir (OMNICEF) 300 MG capsule Take 1 capsule (300 mg total) by mouth 2 (two) times daily. 06/25/14   Charm Rings, MD  fluconazole (DIFLUCAN) 150 MG tablet Take 1 tablet (150 mg total) by mouth once. AFTER finishing Truman Hayward 06/25/14   Charm Rings, MD  ibuprofen (ADVIL,MOTRIN) 200 MG tablet Take 200-800 mg by mouth every 4  (four) hours as needed for headache.    Historical Provider, MD  nystatin cream (MYCOSTATIN) Apply to affected area 2 times daily for 1 week. 06/25/14   Charm Rings, MD  omeprazole (PRILOSEC) 20 MG capsule Take 1 capsule (20 mg total) by mouth daily. 04/13/13   Roxy Horseman, PA-C   BP 131/78 mmHg  Pulse 85  Temp(Src) 98.5 F (36.9 C) (Oral)  Resp 20  SpO2 100%  LMP 06/18/2014 Physical Exam  Constitutional: She is oriented to person, place, and time. She appears well-developed and well-nourished. No distress.  HENT:  Nose: Nose normal.  Right tonsil is erythematous and swollen with exudate present. Bilateral TMs are normal.  Neck: Neck supple.  Cardiovascular: Normal rate.   Pulmonary/Chest: Effort normal.  Lymphadenopathy:    She has no cervical adenopathy.  Neurological: She is alert and oriented to person, place, and time.  Skin:  Well-defined erythematous rash underneath the left breast    ED Course  Procedures (including critical care time) Labs Review Labs Reviewed  POCT RAPID STREP A    Imaging Review No results found.   MDM   1. Tonsillitis   2. Intertrigo    Treat tonsillitis with Omnicef. Nystatin for intertrigo. Diflucan to take after Omnicef. Follow-up as needed.    Charm Rings, MD 06/25/14 2052

## 2014-06-25 NOTE — Discharge Instructions (Signed)
You have an infection of your tonsils. Take Omnicef 1 pill twice a day for 7 days.  The rash under your breast is caused by yeast. Wash the area with soap and water twice a day. Dry very well. Apply nystatin twice a day.  Once you have finished her antibiotics, take Diflucan.  Follow-up as needed.

## 2014-06-25 NOTE — ED Notes (Signed)
C/o rash under right breast onset today Also reports ST x2-3 days associated w/right ear pain Denies fevers, chills Alert, no signs of acute distress.

## 2014-06-28 LAB — CULTURE, GROUP A STREP: STREP A CULTURE: NEGATIVE

## 2014-06-28 NOTE — ED Notes (Signed)
C/o not feeling any better, feeling worse, and her neck feels weak. Patient states she has take Rx for 2 days. Strep negative. Patient advised to be rechecked, as she is feeling worse and having new symptoms

## 2014-11-04 ENCOUNTER — Emergency Department (HOSPITAL_COMMUNITY)
Admission: EM | Admit: 2014-11-04 | Discharge: 2014-11-04 | Disposition: A | Discharge status: Home / Self Care | Payer: PRIVATE HEALTH INSURANCE | Attending: Emergency Medicine | Admitting: Emergency Medicine

## 2014-11-04 ENCOUNTER — Encounter (HOSPITAL_COMMUNITY): Payer: Self-pay | Admitting: Emergency Medicine

## 2014-11-04 DIAGNOSIS — Z79818 Long term (current) use of other agents affecting estrogen receptors and estrogen levels: Secondary | ICD-10-CM | POA: Diagnosis not present

## 2014-11-04 DIAGNOSIS — Z8659 Personal history of other mental and behavioral disorders: Secondary | ICD-10-CM | POA: Insufficient documentation

## 2014-11-04 DIAGNOSIS — Z79899 Other long term (current) drug therapy: Secondary | ICD-10-CM | POA: Diagnosis not present

## 2014-11-04 DIAGNOSIS — Z792 Long term (current) use of antibiotics: Secondary | ICD-10-CM | POA: Insufficient documentation

## 2014-11-04 DIAGNOSIS — M5432 Sciatica, left side: Secondary | ICD-10-CM | POA: Diagnosis not present

## 2014-11-04 DIAGNOSIS — M545 Low back pain: Secondary | ICD-10-CM | POA: Diagnosis present

## 2014-11-04 MED ORDER — PREDNISONE 10 MG (21) PO TBPK: 10.0000 mg | ORAL_TABLET | Freq: Every day | ORAL | Status: DC

## 2014-11-04 MED ORDER — CYCLOBENZAPRINE HCL 5 MG PO TABS: 5.0000 mg | ORAL_TABLET | Freq: Three times a day (TID) | ORAL | Status: DC | PRN

## 2014-11-04 MED ORDER — HYDROCODONE-ACETAMINOPHEN 5-325 MG PO TABS: 1.0000 | ORAL_TABLET | Freq: Four times a day (QID) | ORAL | Status: DC | PRN

## 2014-11-04 NOTE — ED Notes (Signed)
C/o LBP. Had episode 2 weeks ago. Saw PCP in [place], had "three shots". States the pain resolved but came back 2 days ago.

## 2014-11-04 NOTE — Discharge Instructions (Signed)
Do not drive while taking the narcotic or muscle relaxant because they will make you sleepy. Follow up with your doctor when you get home for further evaluation.

## 2014-11-04 NOTE — ED Provider Notes (Signed)
CSN: [id number]   Arrival date & time 11/04/14 1250  History  By signing my name below, I, Cristina Mack, attest that this documentation has been prepared under the direction and in the presence of Kerrie Buffalo, NP. Electronically Signed: Bethel Mack, ED Scribe. 11/04/2014. 2:36 PM. Chief Complaint  Patient presents with  . Back Pain    HPI Patient is a 24 y.o. female presenting with back pain. The history is provided by the patient. No language interpreter was used.  Back Pain Location:  Lumbar spine Quality:  Aching Radiates to:  Does not radiate Pain severity:  Severe Onset quality:  Sudden Duration:  2 weeks Timing:  Constant Progression:  Unchanged Chronicity:  New Context: not falling, not jumping from heights, not lifting heavy objects, not MCA, not MVA, not occupational injury, not pedestrian accident, not physical stress, not recent injury and not twisting   Relieved by: Standing and pressure. Worsened by:  Movement Ineffective treatments:  Ibuprofen and muscle relaxants Associated symptoms: no weakness    Cristina Mack is a 24 y.o. female who presents to the Emergency Department complaining of new and constant left lower back pain with sudden onset 2 weeks ago. Pt describes the pain as aching and rates it 9/10 in severity.  She was seen by a family doctor in [place] 2 weeks ago where she had a negative XR and was given "3 shots" with some pain improvement. She remembers being given cortisone and lidocaine but is unsure what the other injection contained. The pain returned 2 days ago and was worse when she woke up this morning. The pain does not radiate down the LLE. Movement of the neck variably exacerbates the pain. Standing and pressure improve the pain. 800 mg of motrin and 1 dose of a muscle relaxant provided insufficient relief PTA. Her last dose of Motrin was 30 minutes ago and before that she had a dose last night around 1 AM. No new exercise programs, lifting,  pulling, or straining. She denies focal weakness and incontinence of bowel or bladder.  Pt is visiting the area for the weekend. Her only daily medications are adderall and birth control.   Past Medical History  Diagnosis Date  . Attention deficit     History reviewed. No pertinent past surgical history.  Family History  Problem Relation Age of Onset  . Hypertension Mother   . Cancer Other     Social History  Substance Use Topics  . Smoking status: Never Smoker   . Smokeless tobacco: Never Used  . Alcohol Use: No     Review of Systems  Musculoskeletal: Positive for back pain.  Neurological: Negative for weakness.  All other systems reviewed and are negative.   Home Medications   Prior to Admission medications   Medication Sig Start Date End Date Taking? Authorizing Provider  cefdinir (OMNICEF) 300 MG capsule Take 1 capsule (300 mg total) by mouth 2 (two) times daily. 06/25/14   Charm Rings, MD  cyclobenzaprine (FLEXERIL) 5 MG tablet Take 1 tablet (5 mg total) by mouth 3 (three) times daily as needed for muscle spasms. 11/04/14   Rebie Peale Orlene Och, NP  fluconazole (DIFLUCAN) 150 MG tablet Take 1 tablet (150 mg total) by mouth once. AFTER finishing Truman Hayward 06/25/14   Charm Rings, MD  HYDROcodone-acetaminophen (NORCO) 5-325 MG tablet Take 1 tablet by mouth every 6 (six) hours as needed. 11/04/14   Decie Verne Orlene Och, NP  ibuprofen (ADVIL,MOTRIN) 200 MG tablet Take 200-800 mg by  mouth every 4 (four) hours as needed for headache.    Historical Provider, MD  methylphenidate (CONCERTA) 54 MG CR tablet Take 54 mg by mouth every morning.    Historical Provider, MD  norethindrone-ethinyl estradiol (JUNEL FE,GILDESS FE,LOESTRIN FE) 1-20 MG-MCG tablet Take 1 tablet by mouth daily.    Historical Provider, MD  nystatin cream (MYCOSTATIN) Apply to affected area 2 times daily for 1 week. 06/25/14   Charm RingsErin J Honig, MD  omeprazole (PRILOSEC) 20 MG capsule Take 1 capsule (20 mg total) by mouth daily. 04/13/13    Roxy Horsemanobert Browning, PA-C  predniSONE (STERAPRED UNI-PAK 21 TAB) 10 MG (21) TBPK tablet Take 1 tablet (10 mg total) by mouth daily. Take 6 tabs by mouth daily  for 1 day, then 5 tabs for 1 day, then 4 tabs for 1 day, then 3 tabs for 1 day, 2 tabs for 1 day, then 1 tab by mouth daily for 1 days 11/04/14   Janne NapoleonHope M Skylar Flynt, NP    Allergies  Review of patient's allergies indicates no known allergies.  Triage Vitals: BP 135/64 mmHg  Pulse 96  Temp(Src) 97.4 F (36.3 C) (Oral)  Resp 20  Ht 5\' 4"  (1.626 m)  Wt 274 lb (124.286 kg)  BMI 47.01 kg/m2  SpO2 99%  Physical Exam  Constitutional: She is oriented to person, place, and time. She appears well-developed and well-nourished. No distress.  HENT:  Head: Normocephalic and atraumatic.  Right Ear: Tympanic membrane normal.  Left Ear: Tympanic membrane normal.  Nose: Nose normal.  Mouth/Throat: Uvula is midline, oropharynx is clear and moist and mucous membranes are normal.  Eyes: EOM are normal.  Neck: Normal range of motion. Neck supple.  Cardiovascular: Normal rate and regular rhythm.   Pulmonary/Chest: Effort normal. She has no wheezes. She has no rales.  Abdominal: Soft. Bowel sounds are normal. There is no tenderness.  Musculoskeletal: Normal range of motion.       Lumbar back: She exhibits pain and spasm. She exhibits normal pulse.  No tenderness over the cervical, thoracic, or lumbar spine. Tenderness and spasm over the left sciatic nerve.  Neurological: She is alert and oriented to person, place, and time. She has normal strength. No cranial nerve deficit or sensory deficit. Gait normal.  Reflex Scores:      Bicep reflexes are 2+ on the right side and 2+ on the left side.      Brachioradialis reflexes are 2+ on the right side and 2+ on the left side.      Patellar reflexes are 2+ on the right side and 2+ on the left side.      Achilles reflexes are 2+ on the right side and 2+ on the left side. Skin: Skin is warm and dry.  Psychiatric:  She has a normal mood and affect. Her behavior is normal.  Nursing note and vitals reviewed.   ED Course  Procedures  DIAGNOSTIC STUDIES: Oxygen Saturation is 99% on RA,  normal by my interpretation.    COORDINATION OF CARE: 1:19 PM Discussed treatment plan which includes steroid taper and muscle relaxant with pt at bedside and pt agreed to the plan.  MDM  24 y.o. female with low back pain with initial onset 2 weeks ago and [facility] Stable for d/c without focal neuro deficits. Will treat for pain and muscle spasm and she will follow up with her PCP when she returns home in 2 days. She will return here  sooner for worsening symptoms.   Final diagnoses:  Sciatica of left side    I personally performed the services described in this documentation, which was scribed in my presence. The recorded information has been reviewed and is accurate.      [address] [place], [place] 11/05/14 1612  Raeford Razor, MD 11/13/14 2116

## 2017-10-30 ENCOUNTER — Encounter (HOSPITAL_COMMUNITY): Payer: Self-pay

## 2017-10-30 ENCOUNTER — Ambulatory Visit (HOSPITAL_COMMUNITY)
Admission: EM | Admit: 2017-10-30 | Discharge: 2017-10-30 | Disposition: A | Discharge status: Home / Self Care | Payer: PRIVATE HEALTH INSURANCE | Attending: Family Medicine | Admitting: Family Medicine

## 2017-10-30 DIAGNOSIS — R519 Headache, unspecified: Secondary | ICD-10-CM

## 2017-10-30 DIAGNOSIS — R110 Nausea: Secondary | ICD-10-CM

## 2017-10-30 DIAGNOSIS — J069 Acute upper respiratory infection, unspecified: Secondary | ICD-10-CM

## 2017-10-30 DIAGNOSIS — R51 Headache: Secondary | ICD-10-CM

## 2017-10-30 MED ORDER — KETOROLAC TROMETHAMINE 60 MG/2ML IM SOLN
60.0000 mg | Freq: Once | INTRAMUSCULAR | Status: AC
  Administered 2017-10-30: 60 mg via INTRAMUSCULAR

## 2017-10-30 MED ORDER — FLUTICASONE PROPIONATE 50 MCG/ACT NA SUSP: 1.0000 | Freq: Every day | NASAL | 2 refills | Status: AC

## 2017-10-30 MED ORDER — KETOROLAC TROMETHAMINE 60 MG/2ML IM SOLN
INTRAMUSCULAR | Status: AC
  Filled 2017-10-30: qty 2

## 2017-10-30 MED ORDER — ONDANSETRON 4 MG PO TBDP
ORAL_TABLET | ORAL | Status: AC
  Filled 2017-10-30: qty 1

## 2017-10-30 MED ORDER — ONDANSETRON 4 MG PO TBDP
4.0000 mg | ORAL_TABLET | Freq: Once | ORAL | Status: AC
  Administered 2017-10-30: 4 mg via ORAL

## 2017-10-30 NOTE — ED Triage Notes (Signed)
Pt presents with persistent cough, nausea and ongoing headache. Pt was diagnosed with bronchitis yesterday and given medication but she says it is not working.

## 2017-10-30 NOTE — ED Provider Notes (Signed)
[facility]    CSN: [id number] Arrival date & time: 10/30/17  0901     History   Chief Complaint Chief Complaint  Patient presents with  . Cough  . Nausea    HPI Ronnita Paz is a 27 y.o. female.   Landrie presents with complaints of headache and nausea, in additional to ear pressure and cough. Was seen at a different urgent care yesterday and was prescribed cefdinir, zofran and albuterol and diagnosed with bronchitis. She states her nausea has persisted and feels that the nausea medication has worsened her headache. Tylenol and motrin help but the headache returns once they wear off. No known fevers. Ibuprofen last at 0700. Tylenol yesterday. Has had some chills. No vision change or weakness. No abdominal pain. No shortness of breath . Normal BM today. Currently still on her period. Nausea without vomiting. Cough has improved. Headache is frontal as well as posterior occipital. Hasn't taken any nausea medication today. Hx of ADD.     ROS per HPI.      Past Medical History:  Diagnosis Date  . Attention deficit     Patient Active Problem List   Diagnosis Date Noted  . ADD (attention deficit disorder) 02/08/2011    History reviewed. No pertinent surgical history.  OB History   None      Home Medications    Prior to Admission medications   Medication Sig Start Date End Date Taking? Authorizing Provider  cefdinir (OMNICEF) 300 MG capsule Take 1 capsule (300 mg total) by mouth 2 (two) times daily. 06/25/14   Charm Rings, MD  fluticasone (FLONASE) 50 MCG/ACT nasal spray Place 1 spray into both nostrils daily. 10/30/17   Georgetta Haber, NP  ibuprofen (ADVIL,MOTRIN) 200 MG tablet Take 200-800 mg by mouth every 4 (four) hours as needed for headache.    [provider]  methylphenidate (CONCERTA) 54 MG CR tablet Take 54 mg by mouth every morning.    [provider]  omeprazole (PRILOSEC) 20 MG capsule Take 1 capsule (20 mg total) by mouth  daily. 04/13/13   Roxy Horseman, PA-C    Family History Family History  Problem Relation Age of Onset  . Hypertension Mother   . Cancer Other     Social History Social History   Tobacco Use  . Smoking status: Never Smoker  . Smokeless tobacco: Never Used  Substance Use Topics  . Alcohol use: No  . Drug use: Not on file     Allergies   Penicillins   Review of Systems Review of Systems   Physical Exam Triage Vital Signs ED Triage Vitals  Enc Vitals Group     BP 10/30/17 0917 120/85     Pulse Rate 10/30/17 0917 89     Resp 10/30/17 0917 20     Temp 10/30/17 0917 97.7 F (36.5 C)     Temp Source 10/30/17 0917 Oral     SpO2 10/30/17 0917 100 %     Weight --      Height --      Head Circumference --      Peak Flow --      Pain Score 10/30/17 0918 6     Pain Loc --      Pain Edu? --      Excl. in GC? --    No data found.  Updated Vital Signs BP 120/85 (BP Location: Left Arm)   Pulse 89   Temp 97.7 F (36.5 C) (Oral)  Resp 20   LMP 10/26/2017   SpO2 100%    Physical Exam  Constitutional: She is oriented to person, place, and time. She appears well-developed and well-nourished. No distress.  Tearful in discomfort   HENT:  Head: Normocephalic and atraumatic.  Right Ear: Tympanic membrane, external ear and ear canal normal.  Left Ear: Tympanic membrane, external ear and ear canal normal.  Nose: Rhinorrhea present.  Mouth/Throat: Uvula is midline, oropharynx is clear and moist and mucous membranes are normal. No tonsillar exudate.  Eyes: Pupils are equal, round, and reactive to light. Conjunctivae and EOM are normal.  Cardiovascular: Normal rate, regular rhythm and normal heart sounds.  Pulmonary/Chest: Effort normal and breath sounds normal.  No cough throughout exam   Neurological: She is alert and oriented to person, place, and time. No cranial nerve deficit or sensory deficit. She exhibits normal muscle tone.  Skin: Skin is warm and dry.      UC Treatments / Results  Labs (all labs ordered are listed, but only abnormal results are displayed) Labs Reviewed - No data to display  EKG None  Radiology No results found.  Procedures Procedures (including critical care time)  Medications Ordered in UC Medications  ondansetron (ZOFRAN-ODT) disintegrating tablet 4 mg (has no administration in time range)  ketorolac (TORADOL) injection 60 mg (has no administration in time range)    Initial Impression / Assessment and Plan / UC Course  I have reviewed the triage vital signs and the nursing notes.  Pertinent labs & imaging results that were available during my care of the patient were reviewed by me and considered in my medical decision making (see chart for details).     Non toxic, afebrile. Headache and nausea present, hasn't taken any nausea medication today, has been a few hours since ibuprofen which has helped some with headache. Already on abx and has inhaler for cough. toradol provided in clinic today as well as ODT zofran. Encouraged some benadryl and rest once at home. Fluids. Daily flonase to help with ear pressure. Return precautions provided. If symptoms worsen or do not improve in the next week to return to be seen or to follow up with PCP. Patient verbalized understanding and agreeable to plan.   Final Clinical Impressions(s) / UC Diagnoses   Final diagnoses:  Upper respiratory tract infection, unspecified type  Nausea  Nonintractable headache, unspecified chronicity pattern, unspecified headache type     Discharge Instructions     Small frequent sips of fluids- Pedialyte, Gatorade, water, broth- to maintain hydration.   Zofran every 8 hours for nausea, may repeat in another 8 hours.  Tylenol and motrin as needed for headache. Do not take additional ibuprofen for another 8 hours.  May take a benadryl once at home as this may help with headache, congestion and nausea.  Rest.  Daily flonase to help with  ear pressure.  If develop worsening of headache, vision changes, weakness, dizziness, fever, or otherwise worsening please return or go to the Er.  Please continue with previously prescribed medications.    ED Prescriptions    Medication Sig Dispense Auth. Provider   fluticasone (FLONASE) 50 MCG/ACT nasal spray Place 1 spray into both nostrils daily. 16 g Georgetta Haber, NP     Controlled Substance Prescriptions [place] Controlled Substance Registry consulted? Not Applicable   Georgetta Haber, NP 10/30/17 [phone]

## 2017-10-30 NOTE — Discharge Instructions (Addendum)
Small frequent sips of fluids- Pedialyte, Gatorade, water, broth- to maintain hydration.   Zofran every 8 hours for nausea, may repeat in another 8 hours.  Tylenol and motrin as needed for headache. Do not take additional ibuprofen for another 8 hours.  May take a benadryl once at home as this may help with headache, congestion and nausea.  Rest.  Daily flonase to help with ear pressure.  If develop worsening of headache, vision changes, weakness, dizziness, fever, or otherwise worsening please return or go to the Er.  Please continue with previously prescribed medications.

## 2017-10-31 ENCOUNTER — Emergency Department (HOSPITAL_COMMUNITY)
Admission: EM | Admit: 2017-10-31 | Discharge: 2017-10-31 | Disposition: A | Discharge status: Home / Self Care | Payer: PRIVATE HEALTH INSURANCE | Attending: Emergency Medicine | Admitting: Emergency Medicine

## 2017-10-31 ENCOUNTER — Emergency Department (HOSPITAL_COMMUNITY): Payer: PRIVATE HEALTH INSURANCE

## 2017-10-31 ENCOUNTER — Encounter (HOSPITAL_COMMUNITY): Payer: Self-pay | Admitting: Emergency Medicine

## 2017-10-31 DIAGNOSIS — R51 Headache: Secondary | ICD-10-CM | POA: Diagnosis present

## 2017-10-31 DIAGNOSIS — G43901 Migraine, unspecified, not intractable, with status migrainosus: Secondary | ICD-10-CM | POA: Insufficient documentation

## 2017-10-31 DIAGNOSIS — F909 Attention-deficit hyperactivity disorder, unspecified type: Secondary | ICD-10-CM | POA: Insufficient documentation

## 2017-10-31 DIAGNOSIS — G43001 Migraine without aura, not intractable, with status migrainosus: Secondary | ICD-10-CM

## 2017-10-31 MED ORDER — METOCLOPRAMIDE HCL 5 MG/ML IJ SOLN
10.0000 mg | Freq: Once | INTRAMUSCULAR | Status: AC
  Administered 2017-10-31: 10 mg via INTRAMUSCULAR
  Filled 2017-10-31: qty 2

## 2017-10-31 MED ORDER — KETOROLAC TROMETHAMINE 60 MG/2ML IM SOLN
60.0000 mg | Freq: Once | INTRAMUSCULAR | Status: AC
  Administered 2017-10-31: 60 mg via INTRAMUSCULAR
  Filled 2017-10-31: qty 2

## 2017-10-31 MED ORDER — METOCLOPRAMIDE HCL 10 MG PO TABS: 10.0000 mg | ORAL_TABLET | Freq: Four times a day (QID) | ORAL | 0 refills | Status: AC | PRN

## 2017-10-31 MED ORDER — DIPHENHYDRAMINE HCL 25 MG PO CAPS
50.0000 mg | ORAL_CAPSULE | Freq: Once | ORAL | Status: AC
  Administered 2017-10-31: 50 mg via ORAL
  Filled 2017-10-31: qty 2

## 2017-10-31 MED ORDER — ACETAMINOPHEN 500 MG PO TABS
1000.0000 mg | ORAL_TABLET | Freq: Once | ORAL | Status: AC
  Administered 2017-10-31: 1000 mg via ORAL
  Filled 2017-10-31: qty 2

## 2017-10-31 NOTE — ED Triage Notes (Signed)
Patient reports persistent headache onset this week with bilateral otalgia unrelieved by OTC medication , seen at [facility] urgent care yesterday for the same complaint .

## 2017-10-31 NOTE — ED Provider Notes (Signed)
[facility] EMERGENCY DEPARTMENT Provider Note   CSN: [id number] Arrival date & time: 10/31/17  0524     History   Chief Complaint Chief Complaint  Patient presents with  . Headache    HPI Cristina Mack is a 27 y.o. female.  Patient with ADD history presents with worsening headache for the past week.  Patient states gradual onset however different than previous and now worsening.  Photosensitivity, bilateral temporal region.  Pressure sensation.  No history of migraine, no family history of migraine or aneurysm.  Family history of brain tumor.  Nothing over-the-counter has improved medications.  Patient was seen in urgent care received shot and has been on antibiotics for her sinus/lung infection however no improvement.     Past Medical History:  Diagnosis Date  . Attention deficit     Patient Active Problem List   Diagnosis Date Noted  . ADD (attention deficit disorder) 02/08/2011    History reviewed. No pertinent surgical history.   OB History   None      Home Medications    Prior to Admission medications   Medication Sig Start Date End Date Taking? Authorizing Provider  acetaminophen (TYLENOL) 500 MG tablet Take 1,000 mg by mouth every 6 (six) hours as needed for headache.   Yes [provider]  cefdinir (OMNICEF) 300 MG capsule Take 1 capsule (300 mg total) by mouth 2 (two) times daily. 06/25/14  Yes Charm Rings, MD  diphenhydrAMINE (BENADRYL) 12.5 MG/5ML elixir Take 25 mg by mouth 4 (four) times daily as needed for sleep.   Yes [provider]  fluticasone (FLONASE) 50 MCG/ACT nasal spray Place 1 spray into both nostrils daily. 10/30/17  Yes Burky, Dorene Grebe B, NP  ibuprofen (ADVIL,MOTRIN) 200 MG tablet Take 800 mg by mouth every 4 (four) hours as needed for headache.    Yes [provider]  ondansetron (ZOFRAN-ODT) 4 MG disintegrating tablet Take 4 mg by mouth as needed. 10/29/17  Yes [provider]    metoCLOPramide (REGLAN) 10 MG tablet Take 1 tablet (10 mg total) by mouth every 6 (six) hours as needed for nausea (nausea/headache). 10/31/17   Blane Ohara, MD  omeprazole (PRILOSEC) 20 MG capsule Take 1 capsule (20 mg total) by mouth daily. Patient not taking: Reported on 10/31/2017 04/13/13   Roxy Horseman, PA-C  PROAIR HFA 108 [phone] Base) MCG/ACT inhaler Inhale 2 puffs into the lungs 4 (four) times daily as needed. 10/29/17   [provider]    Family History Family History  Problem Relation Age of Onset  . Hypertension Mother   . Cancer Other     Social History Social History   Tobacco Use  . Smoking status: Never Smoker  . Smokeless tobacco: Never Used  Substance Use Topics  . Alcohol use: No  . Drug use: Not on file     Allergies   Penicillins   Review of Systems Review of Systems  Constitutional: Positive for appetite change. Negative for chills and fever.  HENT: Negative for congestion.   Eyes: Positive for photophobia. Negative for visual disturbance.  Respiratory: Negative for shortness of breath.   Cardiovascular: Negative for chest pain.  Gastrointestinal: Negative for abdominal pain and vomiting.  Genitourinary: Negative for dysuria and flank pain.  Musculoskeletal: Negative for back pain, neck pain and neck stiffness.  Skin: Negative for rash.  Neurological: Positive for headaches. Negative for weakness, light-headedness and numbness.     Physical Exam Updated Vital Signs BP 140/80  Pulse 100   Temp 98.3 F (36.8 C) (Oral)   Resp 16   LMP 10/26/2017   SpO2 98%   Physical Exam  Constitutional: She is oriented to person, place, and time. She appears well-developed and well-nourished.  HENT:  Head: Normocephalic and atraumatic.  Eyes: Conjunctivae are normal. Right eye exhibits no discharge. Left eye exhibits no discharge.  Neck: Normal range of motion. Neck supple. No tracheal deviation present.  Cardiovascular: Normal rate and  regular rhythm.  Pulmonary/Chest: Effort normal and breath sounds normal.  Abdominal: Soft. She exhibits no distension. There is no tenderness. There is no guarding.  Musculoskeletal: She exhibits no edema.  Neurological: She is alert and oriented to person, place, and time.  5+ strength in UE and LE with f/e at major joints. Sensation to palpation intact in UE and LE. CNs 2-12 grossly intact.  EOMFI.  PERRL.   Finger nose and coordination intact bilateral.   Visual fields intact to finger testing. No nystagmus   Skin: Skin is warm. No rash noted.  Psychiatric: She has a normal mood and affect.  Nursing note and vitals reviewed.    ED Treatments / Results  Labs (all labs ordered are listed, but only abnormal results are displayed) Labs Reviewed - No data to display  EKG None  Radiology No results found.  Procedures Procedures (including critical care time)  Medications Ordered in ED Medications  metoCLOPramide (REGLAN) injection 10 mg (10 mg Intramuscular Given 10/31/17 0820)  diphenhydrAMINE (BENADRYL) capsule 50 mg (50 mg Oral Given 10/31/17 0820)  acetaminophen (TYLENOL) tablet 1,000 mg (1,000 mg Oral Given 10/31/17 0820)  ketorolac (TORADOL) injection 60 mg (60 mg Intramuscular Given 10/31/17 1007)     Initial Impression / Assessment and Plan / ED Course  I have reviewed the triage vital signs and the nursing notes.  Pertinent labs & imaging results that were available during my care of the patient were reviewed by me and considered in my medical decision making (see chart for details).    Patient presents with gradual onset headache recurrent despite 2 visits to providers in the community.  Headache is different than usual, clinically concern for migraine with light sensitivity pressure sensation and nausea however no family history.  Patient given migraine-like meds with improvement, CT scan of the head no acute findings.  Pt improved on  reassessment. \ Results and differential diagnosis were discussed with the patient/parent/guardian. Xrays were independently reviewed by myself.  Close follow up outpatient was discussed, comfortable with the plan.   Medications  metoCLOPramide (REGLAN) injection 10 mg (10 mg Intramuscular Given 10/31/17 0820)  diphenhydrAMINE (BENADRYL) capsule 50 mg (50 mg Oral Given 10/31/17 0820)  acetaminophen (TYLENOL) tablet 1,000 mg (1,000 mg Oral Given 10/31/17 0820)  ketorolac (TORADOL) injection 60 mg (60 mg Intramuscular Given 10/31/17 1007)    Vitals:   10/31/17 0830 10/31/17 0900 10/31/17 0930 10/31/17 1000  BP: 122/73   140/80  Pulse:  95 98 100  Resp:    16  Temp:      TempSrc:      SpO2:    98%    Final diagnoses:  Migraine without aura and with status migrainosus, not intractable     Final Clinical Impressions(s) / ED Diagnoses   Final diagnoses:  Migraine without aura and with status migrainosus, not intractable    ED Discharge Orders         Ordered    metoCLOPramide (REGLAN) 10 MG tablet  Every 6 hours PRN  10/31/17 1001           Blane Ohara, MD 11/03/17 2247

## 2017-10-31 NOTE — Discharge Instructions (Signed)
Tried Tylenol, ibuprofen as needed for headache. For migraine-like headaches you can try Benadryl with Reglan. Return to the emergency room for neurologic symptoms, fevers, neck stiffness or other concerns.

## 2017-10-31 NOTE — ED Notes (Signed)
Pt verbalized understanding of discharge paperwork and follow-up care. Ambulatory on discharge.  °

## 2019-09-10 IMAGING — CT CT HEAD W/O CM
4 series · 14 of 47 positions shown, 16 images · non-contrast
Comparison: None.

CLINICAL DATA: Four day history of headache

EXAM:
CT HEAD WITHOUT CONTRAST
TECHNIQUE: Contiguous axial images were obtained from the base of the skull
through the vertex without intravenous contrast.

[Series 3: head without · axial · non-contrast · 0.45mm/px · z∈[-158,-18]mm · 7 of 38 slices shown, 9 images]
[im 5/38  brain]
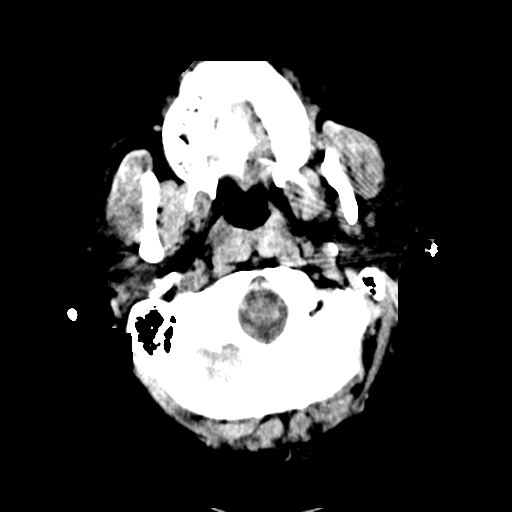
[im 5/38  bone]
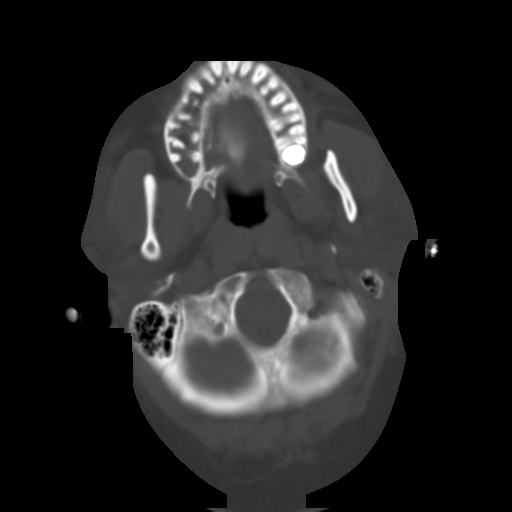
[im 10/38  brain]
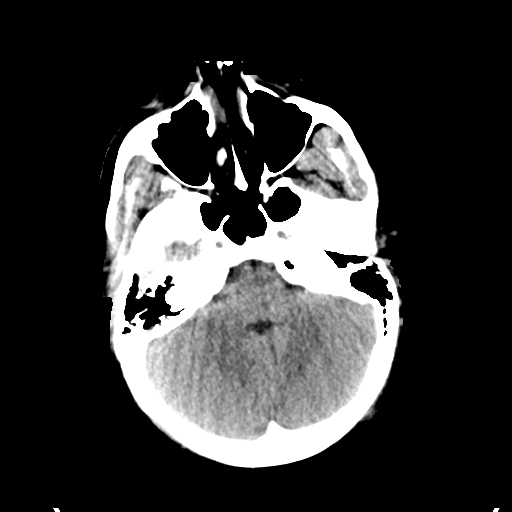
[im 14/38  brain]
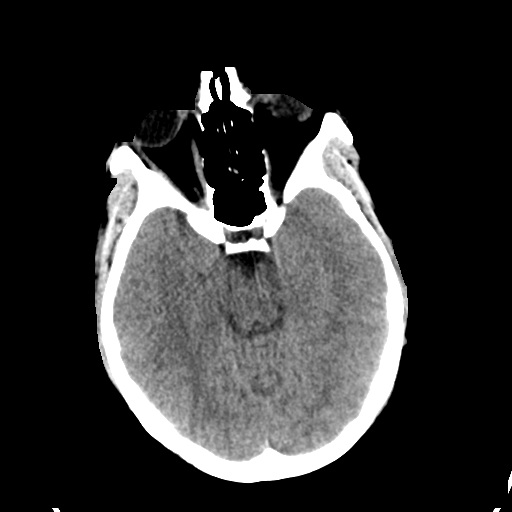
[im 19/38  brain]
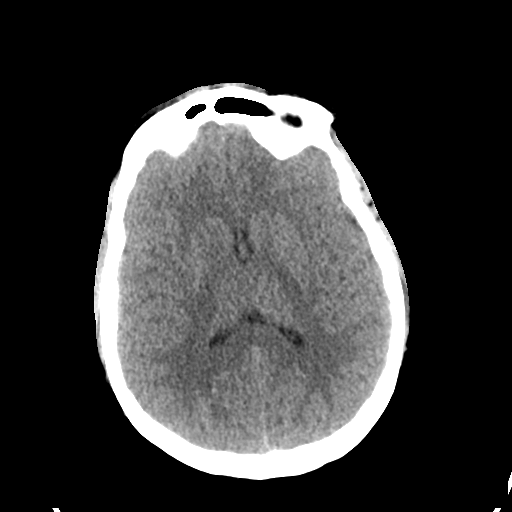
[im 24/38  brain]
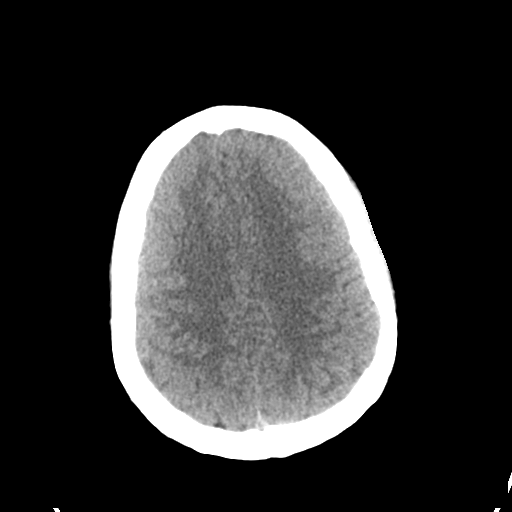
[im 24/38  bone]
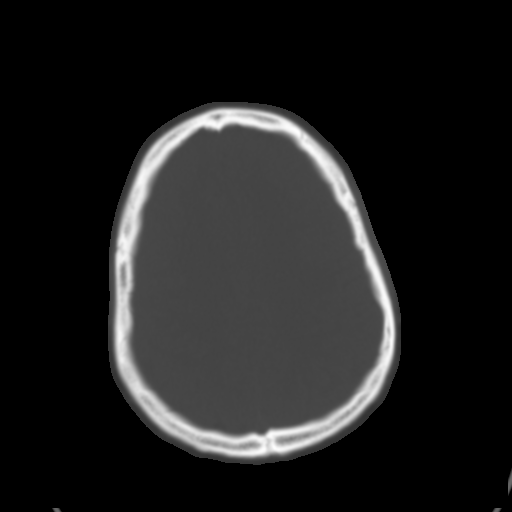
[im 28/38  brain]
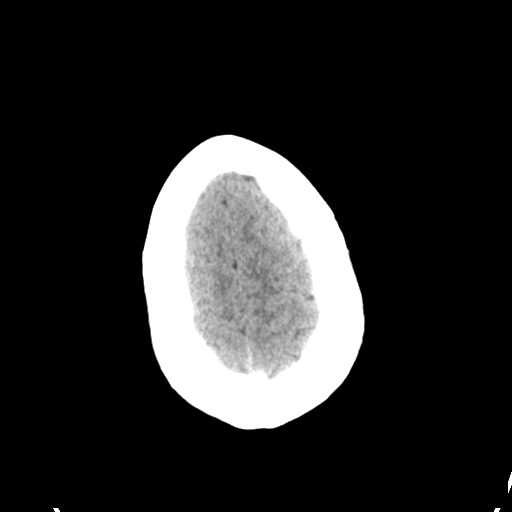
[im 33/38  brain]
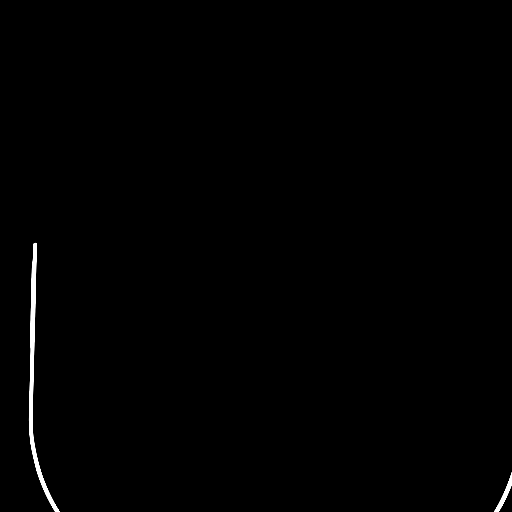

[Series 4: head bone · axial · 0.45mm/px · 1 of 94 slices shown]
[im 10/94  bone]
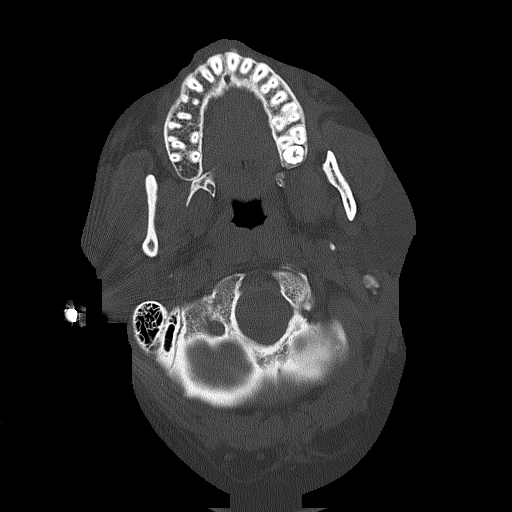

[Series 5: head without cor · coronal · non-contrast · 0.37mm/px · 3 of 83 slices shown]
[im 28/83  brain]
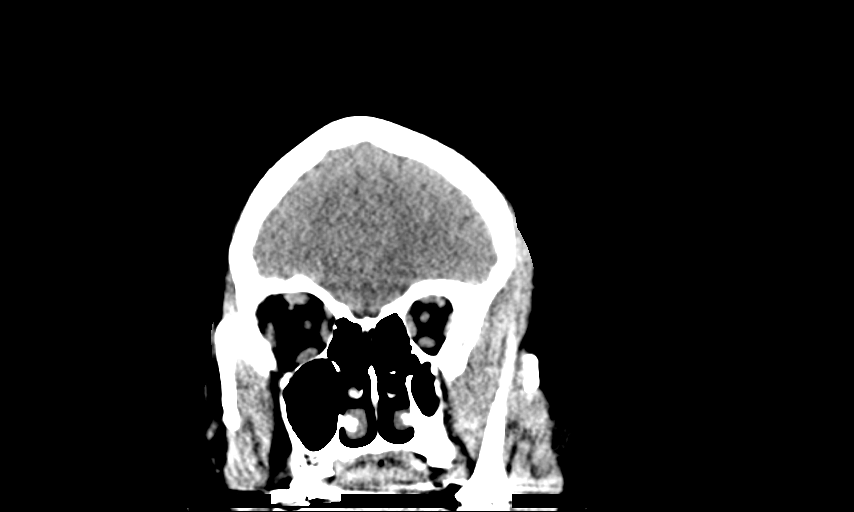
[im 37/83  brain]
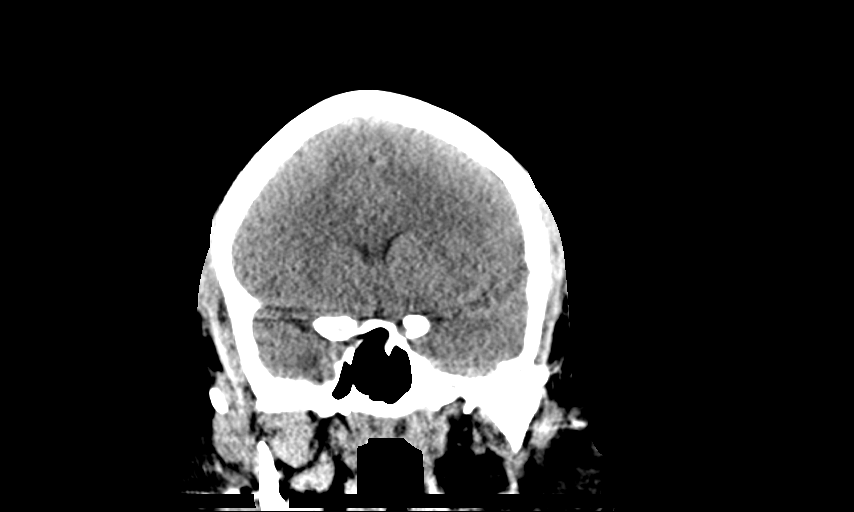
[im 46/83  brain]
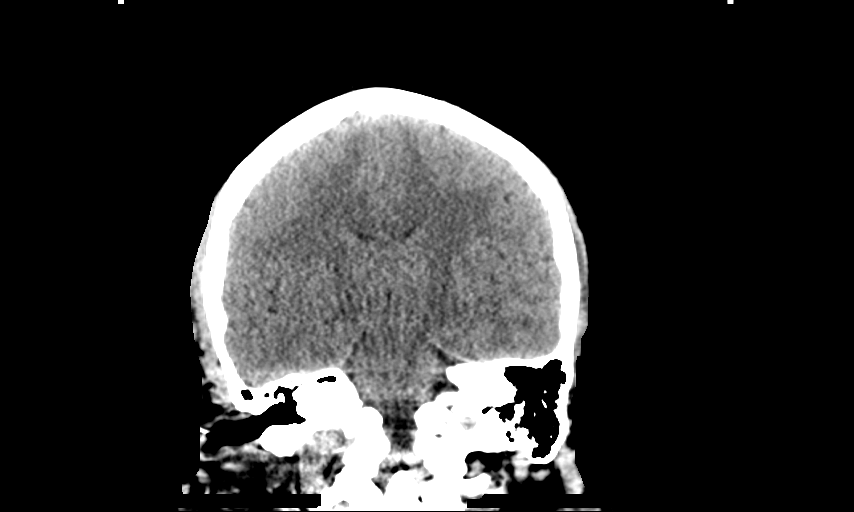

[Series 6: head without sag · sagittal · non-contrast · 0.46mm/px · 3 of 67 slices shown]
[im 23/67  brain]
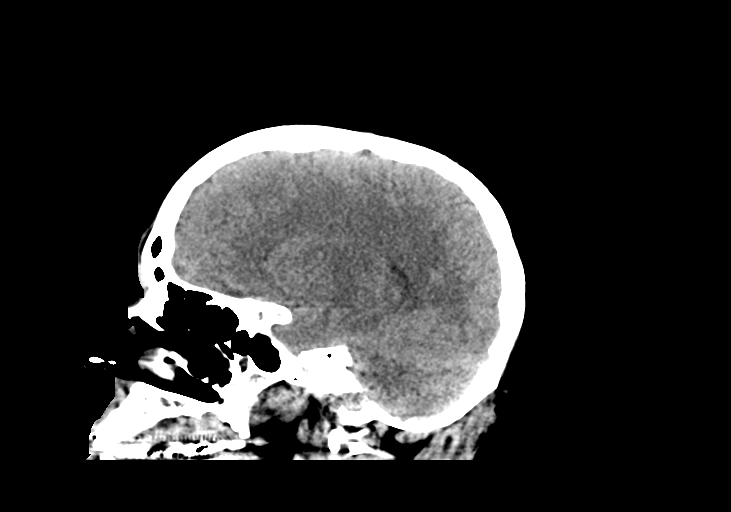
[im 34/67  brain]
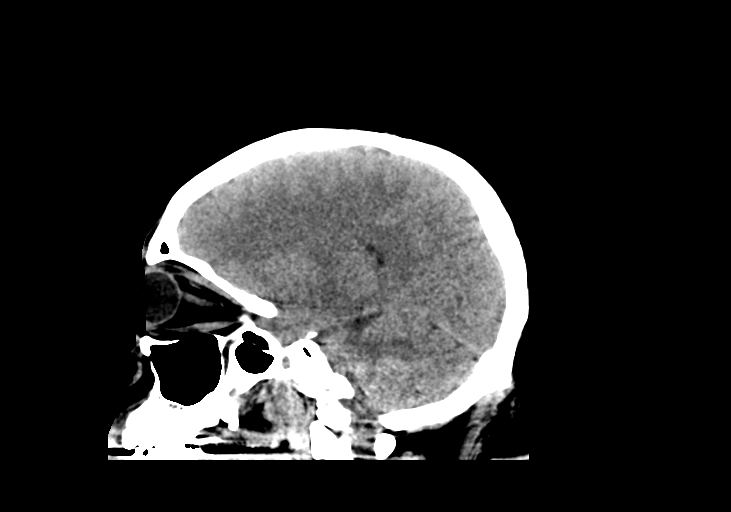
[im 45/67  brain]
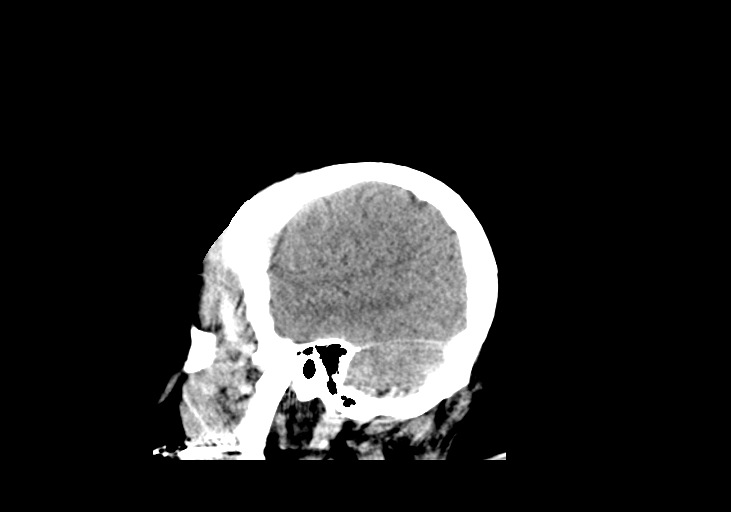

[14 of 47 positions shown; findings below may reference images not displayed]

FINDINGS: Brain: The brain shows a normal appearance without evidence of
malformation, atrophy, old or acute small or large vessel
infarction, mass lesion, hemorrhage, hydrocephalus or extra-axial
collection.

Vascular: No hyperdense vessel. No evidence of atherosclerotic
calcification.

Skull: Normal.  No traumatic finding.  No focal bone lesion.

Sinuses/Orbits: Sinuses are clear. Orbits appear normal. Mastoids
are clear.

Other: None significant
IMPRESSION: Normal head CT
